# Patient Record
Sex: Female | Born: 2000 | Race: White | Hispanic: No | Marital: Single | Smoking: Current every day smoker
Health system: Southern US, Community
[De-identification: ages and names within clinical notes are randomized; demographics above are authoritative.]

## PROBLEM LIST (undated history)

## (undated) DIAGNOSIS — F419 Anxiety disorder, unspecified: Secondary | ICD-10-CM

## (undated) DIAGNOSIS — F32A Depression, unspecified: Secondary | ICD-10-CM

## (undated) HISTORY — DX: Anxiety disorder, unspecified: F41.9

## (undated) HISTORY — DX: Depression, unspecified: F32.A

---
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
## 2022-12-27 ENCOUNTER — Ambulatory Visit (HOSPITAL_COMMUNITY)
Admission: EM | Admit: 2022-12-27 | Discharge: 2022-12-27 | Disposition: A | Discharge status: Home / Self Care | Payer: Medicaid Other | Attending: Internal Medicine | Admitting: Internal Medicine

## 2022-12-27 ENCOUNTER — Encounter (HOSPITAL_COMMUNITY): Payer: Self-pay

## 2022-12-27 DIAGNOSIS — N3001 Acute cystitis with hematuria: Secondary | ICD-10-CM | POA: Diagnosis present

## 2022-12-27 LAB — POCT URINALYSIS DIP (MANUAL ENTRY)
Bilirubin, UA: NEGATIVE
Glucose, UA: 100 mg/dL — AB
Ketones, POC UA: NEGATIVE mg/dL
Nitrite, UA: NEGATIVE
Protein Ur, POC: >=300 mg/dL — AB
Spec Grav, UA: 1.025 (ref 1.010–1.025)
Urobilinogen, UA: 1 E.U./dL
pH, UA: 6.5 (ref 5.0–8.0)

## 2022-12-27 MED ORDER — NITROFURANTOIN MONOHYD MACRO 100 MG PO CAPS: 100.0000 mg | ORAL_CAPSULE | Freq: Two times a day (BID) | ORAL | 0 refills | Status: DC

## 2022-12-27 NOTE — ED Triage Notes (Signed)
Patient here today with c/o urinary frequency and urge to urinate X 1 day. She has also notice more blood in her urine today. She is having some lower abd pain and discomfort and pain in urination as well. She has taken Cranberry tabs and IBU and Tylenol with no relief.

## 2022-12-27 NOTE — Discharge Instructions (Addendum)
You were seen today for urinary symptoms.  Your urine is concerning for urinary tract infection.  I am sending in antibiotics for you to take twice daily for the next 5 days.  We will send a urine culture as well and call you if we need to change your antibiotics.  Please increase your water intake.  Monitor for increased urinary symptoms, fever, chills, nausea or vomiting.

## 2022-12-27 NOTE — ED Provider Notes (Signed)
[facility]    CSN: [id number] Arrival date & time: 12/27/22  1711      History   Chief Complaint Chief Complaint  Patient presents with   Urinary Frequency    HPI Margaret Patton is a 22 y.o. female urgency, frequency, dysuria and blood in her urine. She noticed this yesterday. She has some associated lower abdominal pain. She denies fever, chills, nausea, vomiting. She denies vaginal discharge, odor, abnormal vaginal bleeding, itching and irritation.  She is sexually active but has an IUD.  She has tried ibuprofen, tylenol and cranberry tabs OTC with minimal relief of symptoms.  She has had UTIs in the past but never this bad.  HPI  History reviewed. No pertinent past medical history.  There are no problems to display for this patient.   History reviewed. No pertinent surgical history.  OB History   No obstetric history on file.      Home Medications    Prior to Admission medications   Medication Sig Start Date End Date Taking? Authorizing Provider  loratadine-pseudoephedrine (CLARITIN-D 12-HOUR) 5-120 MG tablet Take 1 tablet by mouth 2 (two) times daily.   Yes [provider]  nitrofurantoin, macrocrystal-monohydrate, (MACROBID) 100 MG capsule Take 1 capsule (100 mg total) by mouth 2 (two) times daily. 12/27/22  Yes Loredana Medellin, Salvadore Oxford, NP  neomycin-polymyxin-hydrocortisone (CORTISPORIN) otic solution Apply one to two drops to toe after soaking twice daily. 06/13/15   Elinor Parkinson, DPM    Family History History reviewed. No pertinent family history.  Social History Social History   Tobacco Use   Smoking status: Every Day    Types: Cigarettes   Smokeless tobacco: Never  Vaping Use   Vaping status: Every Day  Substance Use Topics   Alcohol use: Yes    Comment: occasionally   Drug use: No     Allergies   Patient has no known allergies.   Review of Systems Review of Systems  Constitutional:  Negative for chills and fever.   Respiratory:  Negative for cough and shortness of breath.   Cardiovascular:  Negative for chest pain.  Gastrointestinal:  Positive for abdominal pain. Negative for nausea and vomiting.  Genitourinary:  Positive for dysuria, frequency, hematuria, pelvic pain and urgency. Negative for difficulty urinating, flank pain, vaginal bleeding and vaginal discharge.  Neurological:  Negative for weakness and headaches.     Physical Exam Triage Vital Signs ED Triage Vitals  Encounter Vitals Group     BP 12/27/22 1756 99/67     Systolic BP Percentile --      Diastolic BP Percentile --      Pulse Rate 12/27/22 1756 78     Resp 12/27/22 1756 16     Temp 12/27/22 1756 98 F (36.7 C)     Temp Source 12/27/22 1756 Oral     SpO2 12/27/22 1756 98 %     Weight 12/27/22 1755 210 lb (95.3 kg)     Height 12/27/22 1755 5\' 5"  (1.651 m)     Head Circumference --      Peak Flow --      Pain Score 12/27/22 1754 4     Pain Loc --      Pain Education --      Exclude from Growth Chart --    No data found.  Updated Vital Signs BP 99/67 (BP Location: Right Arm)   Pulse 78   Temp 98 F (36.7 C) (Oral)   Resp 16  Ht 5\' 5"  (1.651 m)   Wt 210 lb (95.3 kg)   LMP 12/08/2022 (Exact Date)   SpO2 98%   BMI 34.95 kg/m      Physical Exam Constitutional:      General: She is not in acute distress. Cardiovascular:     Rate and Rhythm: Normal rate and regular rhythm.     Heart sounds: Normal heart sounds.  Pulmonary:     Effort: Pulmonary effort is normal.     Breath sounds: Normal breath sounds.  Abdominal:     Palpations: Abdomen is soft.     Tenderness: There is abdominal tenderness.     Comments: Mildly tender in the RLQ.  Skin:    General: Skin is warm and dry.     Findings: No rash.  Neurological:     Mental Status: She is alert and oriented to person, place, and time.      UC Treatments / Results  Labs  Labs Reviewed  POCT URINALYSIS DIP (MANUAL ENTRY) - Abnormal; Notable for the  following components:      Result Value   Glucose, UA =100 (*)    Blood, UA large (*)    Protein Ur, POC >=300 (*)    Leukocytes, UA Moderate (2+) (*)    All other components within normal limits  URINE CULTURE     EKG   Radiology No results found.   Medications Ordered in UC Medications - No data to display  Initial Impression / Assessment and Plan / UC Course  I have reviewed the triage vital signs and the nursing notes.  Pertinent labs & imaging results that were available during my care of the patient were reviewed by me and considered in my medical decision making (see chart for details).     22 year old female with 1 day history of urinary urgency, frequency, dysuria and blood in her urine.  Urinalysis shows large blood and moderate leuks.  Rx for Macrobid 100 mg twice daily x 5 days.  Will send urine culture.  Encouraged her to push fluids.  Monitor for worsening urinary symptoms, fever, chills, nausea or vomiting.  Follow-up if symptoms persist or worsen  Final Clinical Impressions(s) / UC Diagnoses   Final diagnoses:  Acute cystitis with hematuria     Discharge Instructions      You were seen today for urinary symptoms.  Your urine is concerning for urinary tract infection.  I am sending in antibiotics for you to take twice daily for the next 5 days.  We will send a urine culture as well and call you if we need to change your antibiotics.  Please increase your water intake.  Monitor for increased urinary symptoms, fever, chills, nausea or vomiting.     ED Prescriptions     Medication Sig Dispense Auth. Provider   nitrofurantoin, macrocrystal-monohydrate, (MACROBID) 100 MG capsule Take 1 capsule (100 mg total) by mouth 2 (two) times daily. 10 capsule Lorre Munroe, NP      PDMP not reviewed this encounter.   Lorre Munroe, NP 12/27/22 [id number]

## 2022-12-29 LAB — URINE CULTURE: Culture: >=100000 — AB

## 2023-03-20 ENCOUNTER — Encounter: Payer: Self-pay | Admitting: Emergency Medicine

## 2023-03-20 ENCOUNTER — Ambulatory Visit
Admission: EM | Admit: 2023-03-20 | Discharge: 2023-03-20 | Disposition: A | Discharge status: Home / Self Care | Payer: Medicaid Other | Attending: Family Medicine | Admitting: Family Medicine

## 2023-03-20 DIAGNOSIS — J069 Acute upper respiratory infection, unspecified: Secondary | ICD-10-CM | POA: Diagnosis present

## 2023-03-20 DIAGNOSIS — B9789 Other viral agents as the cause of diseases classified elsewhere: Secondary | ICD-10-CM | POA: Diagnosis not present

## 2023-03-20 DIAGNOSIS — Z1152 Encounter for screening for COVID-19: Secondary | ICD-10-CM | POA: Diagnosis not present

## 2023-03-20 LAB — GROUP A STREP BY PCR: Group A Strep by PCR: NOT DETECTED

## 2023-03-20 LAB — SARS CORONAVIRUS 2 BY RT PCR: SARS Coronavirus 2 by RT PCR: NEGATIVE

## 2023-03-20 NOTE — Discharge Instructions (Addendum)
Strep and COVID are both negative. I suspect a viral upper respiratory infection.    You can take Tylenol and/or Ibuprofen as needed for fever reduction and pain relief.    For cough: honey 1/2 to 1 teaspoon (you can dilute the honey in water or another fluid).  You can also use guaifenesin and dextromethorphan for cough. You can use a humidifier for chest congestion and cough.  If you don't have a humidifier, you can sit in the bathroom with the hot shower running.      For sore throat: try warm salt water gargles, Mucinex sore throat cough drops or cepacol lozenges, throat spray, warm tea or water with lemon/honey, popsicles or ice, or OTC cold relief medicine for throat discomfort. You can also purchase chloraseptic spray at the pharmacy or dollar store.   For congestion: take a daily anti-histamine like Zyrtec, Claritin, and a oral decongestant, such as pseudoephedrine.  You can also use Flonase 1-2 sprays in each nostril daily. Afrin is also a good option, if you do not have high blood pressure.    It is important to stay hydrated: drink plenty of fluids (water, gatorade/powerade/pedialyte, juices, or teas) to keep your throat moisturized and help further relieve irritation/discomfort.    Return or go to the Emergency Department if symptoms worsen or do not improve in the next few days

## 2023-03-20 NOTE — ED Provider Notes (Signed)
[facility]    CSN: [id number] Arrival date & time: 03/20/23  1015      History   Chief Complaint Chief Complaint  Patient presents with   Nasal Congestion   Sore Throat    HPI Keymora Grillot is a 22 y.o. female.   HPI  History obtained from the patient. [place] presents for sore throat, decreased appetite, headache, sinus pressure, nasal congestion and rhinorrhea that started a few days ago. No cough, fever, vomiting or diarrhea.  Her significant other has a sinus infection.     History reviewed. No pertinent past medical history.  There are no problems to display for this patient.   History reviewed. No pertinent surgical history.  OB History   No obstetric history on file.      Home Medications    Prior to Admission medications   Medication Sig Start Date End Date Taking? Authorizing Provider  Levonorgestrel 13.5 MG IUD by Intrauterine route.   Yes [provider]  loratadine-pseudoephedrine (CLARITIN-D 12-HOUR) 5-120 MG tablet Take 1 tablet by mouth 2 (two) times daily.    [provider]  neomycin-polymyxin-hydrocortisone (CORTISPORIN) otic solution Apply one to two drops to toe after soaking twice daily. 06/13/15   Hyatt, Max T, DPM  nitrofurantoin, macrocrystal-monohydrate, (MACROBID) 100 MG capsule Take 1 capsule (100 mg total) by mouth 2 (two) times daily. 12/27/22   Lorre Munroe, NP    Family History History reviewed. No pertinent family history.  Social History Social History   Tobacco Use   Smoking status: Every Day    Types: Cigarettes   Smokeless tobacco: Never  Vaping Use   Vaping status: Every Day  Substance Use Topics   Alcohol use: Yes    Comment: occasionally   Drug use: No     Allergies   Patient has no known allergies.   Review of Systems Review of Systems: negative unless otherwise stated in HPI.      Physical Exam Triage Vital Signs ED Triage Vitals  Encounter Vitals Group      BP 03/20/23 1050 (!) 113/56     Systolic BP Percentile --      Diastolic BP Percentile --      Pulse Rate 03/20/23 1050 65     Resp 03/20/23 1050 14     Temp 03/20/23 1050 98.2 F (36.8 C)     Temp Source 03/20/23 1050 Oral     SpO2 03/20/23 1050 98 %     Weight 03/20/23 1048 210 lb 1.6 oz (95.3 kg)     Height 03/20/23 1048 5\' 5"  (1.651 m)     Head Circumference --      Peak Flow --      Pain Score 03/20/23 1047 5     Pain Loc --      Pain Education --      Exclude from Growth Chart --    No data found.  Updated Vital Signs BP (!) 113/56 (BP Location: Right Arm)   Pulse 65   Temp 98.2 F (36.8 C) (Oral)   Resp 14   Ht 5\' 5"  (1.651 m)   Wt 95.3 kg   SpO2 98%   BMI 34.96 kg/m   Visual Acuity Right Eye Distance:   Left Eye Distance:   Bilateral Distance:    Right Eye Near:   Left Eye Near:    Bilateral Near:     Physical Exam GEN:     alert, non-ill appearing female in  no distress    HENT:  mucus membranes moist, oropharyngeal without lesions or erythema, no tonsillar hypertrophy or exudates,  moderate erythematous edematous turbinates, clear nasal discharge, bilateral TM normal EYES:   pupils equal and reactive, no scleral injection or discharge NECK:  normal ROM, no lymphadenopathy, no meningismus   RESP:  no increased work of breathing, clear to auscultation bilaterally CVS:   regular rate and rhythm Skin:   warm and dry, no rash on visible skin    UC Treatments / Results  Labs (all labs ordered are listed, but only abnormal results are displayed) Labs Reviewed  GROUP A STREP BY PCR  SARS CORONAVIRUS 2 BY RT PCR    EKG   Radiology No results found.  Procedures Procedures (including critical care time)  Medications Ordered in UC Medications - No data to display  Initial Impression / Assessment and Plan / UC Course  I have reviewed the triage vital signs and the nursing notes.  Pertinent labs & imaging results that were available during my  care of the patient were reviewed by me and considered in my medical decision making (see chart for details).       Pt is a 22 y.o. female who presents for 2-3 days of respiratory symptoms. [place] is afebrile here without recent antipyretics. Satting well on room air. Overall pt is non-ill appearing, well hydrated, without respiratory distress. Pulmonary exam is unremarkable.  COVID an strep testing obtained and were negative. History consistent with viral respiratory illness. Discussed symptomatic treatment.  Explained lack of efficacy of antibiotics in viral disease.  Typical duration of symptoms discussed.   Return and ED precautions given and voiced understanding. Discussed MDM, treatment plan and plan for follow-up with patient who agrees with plan.     Final Clinical Impressions(s) / UC Diagnoses   Final diagnoses:  Viral upper respiratory tract infection     Discharge Instructions      Strep and COVID are both negative. I suspect a viral upper respiratory infection.    You can take Tylenol and/or Ibuprofen as needed for fever reduction and pain relief.    For cough: honey 1/2 to 1 teaspoon (you can dilute the honey in water or another fluid).  You can also use guaifenesin and dextromethorphan for cough. You can use a humidifier for chest congestion and cough.  If you don't have a humidifier, you can sit in the bathroom with the hot shower running.      For sore throat: try warm salt water gargles, Mucinex sore throat cough drops or cepacol lozenges, throat spray, warm tea or water with lemon/honey, popsicles or ice, or OTC cold relief medicine for throat discomfort. You can also purchase chloraseptic spray at the pharmacy or dollar store.   For congestion: take a daily anti-histamine like Zyrtec, Claritin, and a oral decongestant, such as pseudoephedrine.  You can also use Flonase 1-2 sprays in each nostril daily. Afrin is also a good option, if you do not have high blood  pressure.    It is important to stay hydrated: drink plenty of fluids (water, gatorade/powerade/pedialyte, juices, or teas) to keep your throat moisturized and help further relieve irritation/discomfort.    Return or go to the Emergency Department if symptoms worsen or do not improve in the next few days      ED Prescriptions   None    PDMP not reviewed this encounter.   Katha Cabal, DO 03/20/23 1136

## 2023-03-20 NOTE — ED Triage Notes (Signed)
Patient c/o sore throat, nasal congestion, runny nose that started 2 days ago.  Patient denies fevers.

## 2024-02-15 ENCOUNTER — Ambulatory Visit
Admission: EM | Admit: 2024-02-15 | Discharge: 2024-02-15 | Disposition: A | Discharge status: Home / Self Care | Attending: Family Medicine | Admitting: Family Medicine

## 2024-02-15 DIAGNOSIS — N764 Abscess of vulva: Secondary | ICD-10-CM | POA: Diagnosis not present

## 2024-02-15 MED ORDER — CEPHALEXIN 500 MG PO CAPS: 500.0000 mg | ORAL_CAPSULE | Freq: Three times a day (TID) | ORAL | 0 refills | Status: AC

## 2024-02-15 NOTE — ED Triage Notes (Signed)
 Patient staets that she noticed a dime size abscess on her vaginal area. Boyfriend tried draining the area yesterday. Patient staes that the area is more swollen today.

## 2024-02-15 NOTE — Discharge Instructions (Signed)
 Stop by the pharmacy to pick up your prescriptions.  Follow up with your primary care provider or return to the urgent care, if not improving.

## 2024-02-15 NOTE — ED Provider Notes (Signed)
 [facility]    CSN: [id number] Arrival date & time: 02/15/24  1243      History   Chief Complaint Chief Complaint  Patient presents with   Groin Swelling   Abscess     HPI HPI Margaret Patton is a 23 y.o. adult.    Margaret Patton presents for cyst that has been drainage pus for the past 2-3 days but the lump is getting bigger.  First noticed it for the about 4 days ago.  Partner tried to drain the area.  No one has not poked it with anything.       History reviewed. No pertinent past medical history.  There are no active problems to display for this patient.   History reviewed. No pertinent surgical history.  OB History   No obstetric history on file.      Home Medications    Prior to Admission medications   Medication Sig Start Date End Date Taking? Authorizing Provider  buPROPion (WELLBUTRIN XL) 300 MG 24 hr tablet Take 300 mg by mouth. 01/21/24  Yes [provider]  cephALEXin  (KEFLEX ) 500 MG capsule Take 1 capsule (500 mg total) by mouth 3 (three) times daily. 02/15/24  Yes Zanita Millman, DO  escitalopram (LEXAPRO) 10 MG tablet Take 10 mg by mouth. 11/13/23  Yes [provider]  Levonorgestrel 13.5 MG IUD by Intrauterine route.   Yes [provider]  loratadine-pseudoephedrine (CLARITIN-D 12-HOUR) 5-120 MG tablet Take 1 tablet by mouth 2 (two) times daily.    [provider]  neomycin -polymyxin-hydrocortisone (CORTISPORIN) otic solution Apply one to two drops to toe after soaking twice daily. 06/13/15   Hyatt, Max T, DPM  nitrofurantoin , macrocrystal-monohydrate, (MACROBID ) 100 MG capsule Take 1 capsule (100 mg total) by mouth 2 (two) times daily. 12/27/22   Antonette Angeline ORN, NP    Family History History reviewed. No pertinent family history.  Social History Social History   Tobacco Use   Smoking status: Every Day    Types: Cigarettes   Smokeless tobacco: Never  Vaping Use   Vaping status:  Every Day  Substance Use Topics   Alcohol use: Yes    Comment: occasionally   Drug use: No     Allergies   Patient has no known allergies.   Review of Systems Review of Systems: :negative unless otherwise stated in HPI.      Physical Exam Triage Vital Signs ED Triage Vitals  Encounter Vitals Group     BP 02/15/24 1331 114/71     Girls Systolic BP Percentile --      Girls Diastolic BP Percentile --      Boys Systolic BP Percentile --      Boys Diastolic BP Percentile --      Pulse Rate 02/15/24 1331 (!) 58     Resp 02/15/24 1331 18     Temp 02/15/24 1331 98.7 F (37.1 C)     Temp Source 02/15/24 1331 Oral     SpO2 02/15/24 1331 98 %     Weight 02/15/24 1330 210 lb (95.3 kg)     Height --      Head Circumference --      Peak Flow --      Pain Score 02/15/24 1330 3     Pain Loc --      Pain Education --      Exclude from Growth Chart --    No data found.  Updated Vital Signs BP 114/71 (BP  Location: Right Arm)   Pulse (!) 58   Temp 98.7 F (37.1 C) (Oral)   Resp 18   Wt 95.3 kg   SpO2 98%   BMI 34.95 kg/m   Visual Acuity Right Eye Distance:   Left Eye Distance:   Bilateral Distance:    Right Eye Near:   Left Eye Near:    Bilateral Near:     Physical Exam GEN: well appearing female in no acute distress  CVS: well perfused  RESP: speaking in full sentences without pause  GU: external vaginal exam performed, her female partner present for exam, white vaginal discharge present at introitus, left lower labia major with 1.25 cm oblong lesion with drainage but is non-tender    UC Treatments / Results  Labs (all labs ordered are listed, but only abnormal results are displayed) Labs Reviewed - No data to display  EKG   Radiology No results found.  Procedures Procedures (including critical care time)  Medications Ordered in UC Medications - No data to display  Initial Impression / Assessment and Plan / UC Course  I have reviewed the triage  vital signs and the nursing notes.  Pertinent labs & imaging results that were available during my care of the patient were reviewed by me and considered in my medical decision making (see chart for details).      Patient is a 23 y.o.Margaret Patton female  who presents for vulvar lesion.  Overall patient is well-appearing and afebrile.  Vital signs stable. History and exam concerning for developing abscess.  - Treatment: Keflex  TID for 7 days as below   Return precautions including abdominal pain, fever, chills, nausea, or vomiting given. Discussed MDM, treatment plan and plan for follow-up with patient who agrees with plan.        Final Clinical Impressions(s) / UC Diagnoses   Final diagnoses:  Vulvar abscess     Discharge Instructions      Stop by the pharmacy to pick up your prescriptions.  Follow up with your primary care provider or return to the urgent care, if not improving.       ED Prescriptions     Medication Sig Dispense Auth. Provider   cephALEXin  (KEFLEX ) 500 MG capsule Take 1 capsule (500 mg total) by mouth 3 (three) times daily. 21 capsule Daisey Caloca, DO      PDMP not reviewed this encounter.   Kriste Berth, DO 02/15/24 1435

## 2024-03-17 ENCOUNTER — Other Ambulatory Visit: Payer: Self-pay

## 2024-03-17 ENCOUNTER — Ambulatory Visit: Attending: Obstetrics and Gynecology

## 2024-03-17 DIAGNOSIS — R293 Abnormal posture: Secondary | ICD-10-CM | POA: Insufficient documentation

## 2024-03-17 DIAGNOSIS — N94819 Vulvodynia, unspecified: Secondary | ICD-10-CM | POA: Insufficient documentation

## 2024-03-17 DIAGNOSIS — R2689 Other abnormalities of gait and mobility: Secondary | ICD-10-CM | POA: Insufficient documentation

## 2024-03-17 DIAGNOSIS — M6281 Muscle weakness (generalized): Secondary | ICD-10-CM | POA: Diagnosis present

## 2024-03-17 DIAGNOSIS — N3946 Mixed incontinence: Secondary | ICD-10-CM | POA: Insufficient documentation

## 2024-03-17 NOTE — Patient Instructions (Signed)
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
## 2024-03-29 ENCOUNTER — Ambulatory Visit

## 2024-03-29 ENCOUNTER — Other Ambulatory Visit: Payer: Self-pay

## 2024-03-29 DIAGNOSIS — N3946 Mixed incontinence: Secondary | ICD-10-CM

## 2024-03-29 DIAGNOSIS — R293 Abnormal posture: Secondary | ICD-10-CM

## 2024-03-29 DIAGNOSIS — M6281 Muscle weakness (generalized): Secondary | ICD-10-CM

## 2024-03-29 DIAGNOSIS — N94819 Vulvodynia, unspecified: Secondary | ICD-10-CM | POA: Diagnosis not present

## 2024-03-29 DIAGNOSIS — R2689 Other abnormalities of gait and mobility: Secondary | ICD-10-CM
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
## 2024-03-31 ENCOUNTER — Encounter

## 2024-04-07 ENCOUNTER — Ambulatory Visit

## 2024-04-07 ENCOUNTER — Other Ambulatory Visit: Payer: Self-pay

## 2024-04-07 DIAGNOSIS — M6281 Muscle weakness (generalized): Secondary | ICD-10-CM

## 2024-04-07 DIAGNOSIS — R2689 Other abnormalities of gait and mobility: Secondary | ICD-10-CM

## 2024-04-07 DIAGNOSIS — N3946 Mixed incontinence: Secondary | ICD-10-CM

## 2024-04-07 DIAGNOSIS — N94819 Vulvodynia, unspecified: Secondary | ICD-10-CM

## 2024-04-07 DIAGNOSIS — R293 Abnormal posture: Secondary | ICD-10-CM
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
## 2024-04-14 ENCOUNTER — Ambulatory Visit

## 2024-04-18 ENCOUNTER — Ambulatory Visit: Attending: Obstetrics and Gynecology

## 2024-04-18 ENCOUNTER — Other Ambulatory Visit: Payer: Self-pay

## 2024-04-18 DIAGNOSIS — M6281 Muscle weakness (generalized): Secondary | ICD-10-CM | POA: Insufficient documentation

## 2024-04-18 DIAGNOSIS — R293 Abnormal posture: Secondary | ICD-10-CM | POA: Insufficient documentation

## 2024-04-18 DIAGNOSIS — N94819 Vulvodynia, unspecified: Secondary | ICD-10-CM | POA: Insufficient documentation

## 2024-04-18 DIAGNOSIS — N3946 Mixed incontinence: Secondary | ICD-10-CM | POA: Diagnosis present

## 2024-04-18 DIAGNOSIS — R2689 Other abnormalities of gait and mobility: Secondary | ICD-10-CM | POA: Diagnosis present
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
## 2024-05-03 ENCOUNTER — Other Ambulatory Visit: Payer: Self-pay

## 2024-05-03 ENCOUNTER — Ambulatory Visit

## 2024-05-03 DIAGNOSIS — N3946 Mixed incontinence: Secondary | ICD-10-CM

## 2024-05-03 DIAGNOSIS — N94819 Vulvodynia, unspecified: Secondary | ICD-10-CM

## 2024-05-03 DIAGNOSIS — R293 Abnormal posture: Secondary | ICD-10-CM

## 2024-05-03 DIAGNOSIS — R2689 Other abnormalities of gait and mobility: Secondary | ICD-10-CM

## 2024-05-03 DIAGNOSIS — M6281 Muscle weakness (generalized): Secondary | ICD-10-CM
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
## 2024-05-09 ENCOUNTER — Ambulatory Visit

## 2024-05-16 ENCOUNTER — Other Ambulatory Visit: Payer: Self-pay

## 2024-05-16 ENCOUNTER — Ambulatory Visit: Attending: Obstetrics and Gynecology

## 2024-05-16 DIAGNOSIS — R2689 Other abnormalities of gait and mobility: Secondary | ICD-10-CM | POA: Insufficient documentation

## 2024-05-16 DIAGNOSIS — N3946 Mixed incontinence: Secondary | ICD-10-CM | POA: Insufficient documentation

## 2024-05-16 DIAGNOSIS — M6281 Muscle weakness (generalized): Secondary | ICD-10-CM | POA: Insufficient documentation

## 2024-05-16 DIAGNOSIS — R293 Abnormal posture: Secondary | ICD-10-CM | POA: Insufficient documentation

## 2024-05-16 DIAGNOSIS — N94819 Vulvodynia, unspecified: Secondary | ICD-10-CM | POA: Diagnosis present
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
## 2024-05-31 ENCOUNTER — Encounter

## 2024-05-31 ENCOUNTER — Ambulatory Visit
Admission: EM | Admit: 2024-05-31 | Discharge: 2024-05-31 | Disposition: A | Discharge status: Home / Self Care | Attending: Physician Assistant | Admitting: Physician Assistant

## 2024-05-31 DIAGNOSIS — J029 Acute pharyngitis, unspecified: Secondary | ICD-10-CM | POA: Insufficient documentation

## 2024-05-31 DIAGNOSIS — R490 Dysphonia: Secondary | ICD-10-CM

## 2024-05-31 DIAGNOSIS — R051 Acute cough: Secondary | ICD-10-CM

## 2024-05-31 DIAGNOSIS — R102 Pelvic and perineal pain unspecified side: Secondary | ICD-10-CM | POA: Insufficient documentation

## 2024-05-31 DIAGNOSIS — B349 Viral infection, unspecified: Secondary | ICD-10-CM | POA: Insufficient documentation

## 2024-05-31 DIAGNOSIS — Z30431 Encounter for routine checking of intrauterine contraceptive device: Secondary | ICD-10-CM | POA: Insufficient documentation

## 2024-05-31 LAB — POCT RAPID STREP A (OFFICE): Rapid Strep A Screen: NEGATIVE

## 2024-05-31 MED ORDER — PREDNISONE 20 MG PO TABS: 40.0000 mg | ORAL_TABLET | Freq: Every day | ORAL | 0 refills | Status: AC

## 2024-05-31 MED ORDER — PROMETHAZINE-DM 6.25-15 MG/5ML PO SYRP: 5.0000 mL | ORAL_SOLUTION | Freq: Four times a day (QID) | ORAL | 0 refills | Status: AC | PRN

## 2024-05-31 NOTE — Discharge Instructions (Addendum)
-   You have laryngitis and likely also bronchitis, which is a chest cold.  Chest colds can last for a couple weeks. - If you develop any fever, have worsening cough, chest pain, breathing difficulty or weakness please seek reevaluation. - Strep test was negative but I am sending a culture and we will call you if it is positive and send antibiotics if positive.

## 2024-05-31 NOTE — ED Triage Notes (Signed)
 Pt c/o sinus pressure,cough,congestion x5 days & sore throat x1 days. Has tried OTC meds w/o relief.

## 2024-05-31 NOTE — ED Provider Notes (Signed)
 [facility]    CSN: [id number] Arrival date & time: 05/31/24  1550      History   Chief Complaint Chief Complaint  Patient presents with   Sinus Problem   Sore Throat   Hoarse    HPI Margaret Patton is a 23 y.o. adult presenting for 5-day history of nasal congestion, cough, sinus pressure, headaches, sore throat and voice hoarseness.  Denies fever, ear pain, chest pain.  Reports a little shortness of breath.  No abdominal pain, vomiting, diarrhea.  Denies any sick contacts.  Has taken OTC meds without relief.  HPI  Past Medical History:  Diagnosis Date   Anxiety    Depression     Patient Active Problem List   Diagnosis Date Noted   IUD check up 05/31/2024   Pelvic pain in female 05/31/2024    History reviewed. No pertinent surgical history.  OB History   No obstetric history on file.      Home Medications    Prior to Admission medications  Medication Sig Start Date End Date Taking? Authorizing Provider  predniSONE  (DELTASONE ) 20 MG tablet Take 2 tablets (40 mg total) by mouth daily for 5 days. 05/31/24 06/05/24 Yes Arvis Huxley B, PA-C  promethazine -dextromethorphan (PROMETHAZINE -DM) 6.25-15 MG/5ML syrup Take 5 mLs by mouth 4 (four) times daily as needed. 05/31/24  Yes Arvis Huxley NOVAK, PA-C  buPROPion (WELLBUTRIN XL) 300 MG 24 hr tablet Take 300 mg by mouth. 01/21/24   [provider]  cephALEXin  (KEFLEX ) 500 MG capsule Take 1 capsule (500 mg total) by mouth 3 (three) times daily. Patient not taking: Reported on 03/17/2024 02/15/24   Brimage, Vondra, DO  escitalopram (LEXAPRO) 10 MG tablet Take 10 mg by mouth. Patient not taking: Reported on 03/17/2024 11/13/23   [provider]  Levonorgestrel 13.5 MG IUD by Intrauterine route.    [provider]  loratadine-pseudoephedrine (CLARITIN-D 12-HOUR) 5-120 MG tablet Take 1 tablet by mouth 2 (two) times daily.    [provider]    Family History History reviewed.  No pertinent family history.  Social History Social History[1]   Allergies   Latex   Review of Systems Review of Systems  Constitutional:  Positive for fatigue. Negative for chills, diaphoresis and fever.  HENT:  Positive for congestion, rhinorrhea, sinus pressure, sore throat and voice change. Negative for ear pain and sinus pain.   Respiratory:  Positive for cough and shortness of breath.   Cardiovascular:  Negative for chest pain.  Gastrointestinal:  Negative for abdominal pain, nausea and vomiting.  Musculoskeletal:  Negative for arthralgias and myalgias.  Skin:  Negative for rash.  Neurological:  Negative for weakness and headaches.  Hematological:  Negative for adenopathy.     Physical Exam Triage Vital Signs ED Triage Vitals  Encounter Vitals Group     BP 05/31/24 1726 130/80     Girls Systolic BP Percentile --      Girls Diastolic BP Percentile --      Boys Systolic BP Percentile --      Boys Diastolic BP Percentile --      Pulse Rate 05/31/24 1726 74     Resp 05/31/24 1726 18     Temp 05/31/24 1726 98.3 F (36.8 C)     Temp Source 05/31/24 1726 Oral     SpO2 05/31/24 1726 98 %     Weight 05/31/24 1723 218 lb 8 oz (99.1 kg)     Height --  Head Circumference --      Peak Flow --      Pain Score 05/31/24 1725 6     Pain Loc --      Pain Education --      Exclude from Growth Chart --    No data found.  Updated Vital Signs BP 130/80 (BP Location: Right Arm)   Pulse 74   Temp 98.3 F (36.8 C) (Oral)   Resp 18   Wt 218 lb 8 oz (99.1 kg)   SpO2 98%   BMI 36.36 kg/m     Physical Exam Vitals and nursing note reviewed.  Constitutional:      General: Margaret Patton is not in acute distress.    Appearance: Normal appearance. Margaret Patton is ill-appearing. Margaret Patton is not toxic-appearing.  HENT:     Head: Normocephalic and atraumatic.     Right Ear: Tympanic membrane, ear canal and external ear normal.     Left Ear: Tympanic membrane, ear canal and external ear normal.      Nose: Congestion present.     Mouth/Throat:     Mouth: Mucous membranes are moist.     Pharynx: Oropharynx is clear. Posterior oropharyngeal erythema present.  Eyes:     General: No scleral icterus.       Right eye: No discharge.        Left eye: No discharge.     Conjunctiva/sclera: Conjunctivae normal.  Cardiovascular:     Rate and Rhythm: Normal rate and regular rhythm.     Heart sounds: Normal heart sounds.  Pulmonary:     Effort: Pulmonary effort is normal. No respiratory distress.     Breath sounds: Normal breath sounds.  Musculoskeletal:     Cervical back: Neck supple.  Skin:    General: Skin is dry.  Neurological:     General: No focal deficit present.     Mental Status: Margaret Patton is alert. Mental status is at baseline.     Motor: No weakness.     Gait: Gait normal.  Psychiatric:        Mood and Affect: Mood normal.        Behavior: Behavior normal.      UC Treatments / Results  Labs (all labs ordered are listed, but only abnormal results are displayed) Labs Reviewed  POCT RAPID STREP A (OFFICE) - Normal  CULTURE, GROUP A STREP [facility])    EKG   Radiology No results found.  Procedures Procedures (including critical care time)  Medications Ordered in UC Medications - No data to display  Initial Impression / Assessment and Plan / UC Course  I have reviewed the triage vital signs and the nursing notes.  Pertinent labs & imaging results that were available during my care of the patient were reviewed by me and considered in my medical decision making (see chart for details).   23 year old patient presents for cough, congestion, sinus pressure x 5 days.  Recent voice hoarseness and sore throat.  Reports a little shortness of breath.  No fevers.  No sick contacts.  Vitals are all stable and normal.  She is ill-appearing but nontoxic.  Voice is very hoarse.  Has erythema posterior pharynx and mild nasal congestion.  Chest clear.  Heart regular rate rhythm.  Rapid  strep negative.  Will send culture and if positive treat with antibiotics.  Viral illness.  Supportive care encouraged increasing rest and fluids.  Advised patient their symptoms may be moving into their chest and causing a chest  cold.  Explained bronchitis can last a few weeks.  Advised for patient to return if they develop a fever, chest pain or increased shortness of breath or weakness.  Sent prednisone  and Promethazine  DM.  Work note was given.   Final Clinical Impressions(s) / UC Diagnoses   Final diagnoses:  Sore throat  Viral illness  Acute cough  Voice hoarseness     Discharge Instructions      - You have laryngitis and likely also bronchitis, which is a chest cold.  Chest colds can last for a couple weeks. - If you develop any fever, have worsening cough, chest pain, breathing difficulty or weakness please seek reevaluation. - Strep test was negative but I am sending a culture and we will call you if it is positive and send antibiotics if positive.     ED Prescriptions     Medication Sig Dispense Auth. Provider   predniSONE  (DELTASONE ) 20 MG tablet Take 2 tablets (40 mg total) by mouth daily for 5 days. 10 tablet Arvis Huxley B, PA-C   promethazine -dextromethorphan (PROMETHAZINE -DM) 6.25-15 MG/5ML syrup Take 5 mLs by mouth 4 (four) times daily as needed. 118 mL Arvis Huxley NOVAK, PA-C      PDMP not reviewed this encounter.     [1]  Social History Tobacco Use   Smoking status: Some Days    Types: Cigarettes   Smokeless tobacco: Never  Vaping Use   Vaping status: Former  Substance Use Topics   Alcohol use: Yes    Comment: occasionally   Drug use: Yes    Types: Marijuana     Arvis Huxley NOVAK, PA-C 05/31/24 1758

## 2024-06-03 ENCOUNTER — Ambulatory Visit (HOSPITAL_COMMUNITY): Payer: Self-pay

## 2024-06-03 LAB — CULTURE, GROUP A STREP (THRC)

## 2024-06-06 ENCOUNTER — Ambulatory Visit

## 2024-06-20 ENCOUNTER — Other Ambulatory Visit: Payer: Self-pay

## 2024-06-20 ENCOUNTER — Ambulatory Visit: Attending: Obstetrics and Gynecology

## 2024-06-20 DIAGNOSIS — R293 Abnormal posture: Secondary | ICD-10-CM | POA: Insufficient documentation

## 2024-06-20 DIAGNOSIS — N3946 Mixed incontinence: Secondary | ICD-10-CM | POA: Insufficient documentation

## 2024-06-20 DIAGNOSIS — R2689 Other abnormalities of gait and mobility: Secondary | ICD-10-CM | POA: Diagnosis present

## 2024-06-20 DIAGNOSIS — M6281 Muscle weakness (generalized): Secondary | ICD-10-CM | POA: Diagnosis present

## 2024-06-20 DIAGNOSIS — N94819 Vulvodynia, unspecified: Secondary | ICD-10-CM | POA: Insufficient documentation

## 2024-06-20 NOTE — Patient Instructions (Signed)
 Pelvic wand: insert thicker end for tense muscles closer to vaginal opening and hold over trigger point until you feel decr. Tension/pain and muscle melt, repeat as needed and use thinner end to reach pelvic muscles closer to tailbone.
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
## 2024-07-04 ENCOUNTER — Other Ambulatory Visit: Payer: Self-pay

## 2024-07-04 ENCOUNTER — Ambulatory Visit

## 2024-07-04 DIAGNOSIS — N94819 Vulvodynia, unspecified: Secondary | ICD-10-CM | POA: Diagnosis not present

## 2024-07-04 DIAGNOSIS — N3946 Mixed incontinence: Secondary | ICD-10-CM

## 2024-07-04 DIAGNOSIS — R2689 Other abnormalities of gait and mobility: Secondary | ICD-10-CM

## 2024-07-04 DIAGNOSIS — R293 Abnormal posture: Secondary | ICD-10-CM

## 2024-07-04 DIAGNOSIS — M6281 Muscle weakness (generalized): Secondary | ICD-10-CM
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
## 2024-07-07 ENCOUNTER — Ambulatory Visit

## 2024-07-18 ENCOUNTER — Ambulatory Visit

## 2024-07-21 ENCOUNTER — Ambulatory Visit

## 2024-08-01 ENCOUNTER — Ambulatory Visit
